# Patient Record
Sex: Male | Born: 1995 | Race: Black or African American | Hispanic: No | Marital: Single | Smoking: Current every day smoker
Health system: Southern US, Community
[De-identification: ages and names within clinical notes are randomized; demographics above are authoritative.]

---
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
## 2017-07-02 ENCOUNTER — Ambulatory Visit (HOSPITAL_COMMUNITY)
Admission: EM | Admit: 2017-07-02 | Discharge: 2017-07-02 | Disposition: A | Discharge status: Home / Self Care | Payer: Self-pay | Attending: Physician Assistant | Admitting: Physician Assistant

## 2017-07-02 ENCOUNTER — Encounter (HOSPITAL_COMMUNITY): Payer: Self-pay | Admitting: Emergency Medicine

## 2017-07-02 DIAGNOSIS — Z113 Encounter for screening for infections with a predominantly sexual mode of transmission: Secondary | ICD-10-CM

## 2017-07-02 DIAGNOSIS — Z202 Contact with and (suspected) exposure to infections with a predominantly sexual mode of transmission: Secondary | ICD-10-CM

## 2017-07-02 NOTE — Discharge Instructions (Signed)
Cytology and blood work sent, you will be contacted with any positive results that requires further treatment. Refrain from sexual activity for the next 7 days. Using condoms can lower chances of STDs. Monitor for any worsening of symptoms, fever, abdominal pain, nausea, vomiting, to follow up for reevaluation.

## 2017-07-02 NOTE — ED Provider Notes (Signed)
[facility]    CSN: [id number] Arrival date & time: 07/02/17  1240     History   Chief Complaint Chief Complaint  Patient presents with  . Exposure to STD    HPI Brent SauersCameron N Moran is a 21 y.o. male.   21 year old male comes in for STD testing. Patient states one of his partners is being treated for STD, but does not know what. Patient is asymptomatic and specifically denies penile lesions, penile discharge, testicular pain/swelling, abdominal pain. Patient is sexually active with multiple partners, stating "just put down 10", intermittent condom use. He would also like to be tested for HIV, syphilis, HSV      History reviewed. No pertinent past medical history.  There are no active problems to display for this patient.   Past Surgical History:  Procedure Laterality Date  . SCROTAL EXPLORATION N/A 09/01/2013   Procedure: SCROTAL WOUND DEBRIDEMENT;  Surgeon: Milford Cageaniel Young Woodruff, MD;  Location: [facility];  Service: Urology;  Laterality: N/A;       Home Medications    Prior to Admission medications   Not on File    Family History History reviewed. No pertinent family history.  Social History Social History  Substance Use Topics  . Smoking status: Current Every Day Smoker  . Smokeless tobacco: Never Used  . Alcohol use No     Allergies   Other   Review of Systems Review of Systems  Reason unable to perform ROS: See HPI as above.     Physical Exam Triage Vital Signs ED Triage Vitals [07/02/17 1357]  Enc Vitals Group     BP 128/79     Pulse Rate 64     Resp 20     Temp 98.5 F (36.9 C)     Temp Source Oral     SpO2 99 %     Weight      Height      Head Circumference      Peak Flow      Pain Score      Pain Loc      Pain Edu?      Excl. in GC?    No data found.   Updated Vital Signs BP 128/79 (BP Location: Left Arm)   Pulse 64   Temp 98.5 F (36.9 C) (Oral)   Resp 20   SpO2 99%   Physical Exam  Constitutional: He is  oriented to person, place, and time. He appears well-developed and well-nourished. No distress.  HENT:  Head: Normocephalic and atraumatic.  Eyes: Pupils are equal, round, and reactive to light. Conjunctivae are normal.  Neurological: He is alert and oriented to person, place, and time.     UC Treatments / Results  Labs (all labs ordered are listed, but only abnormal results are displayed) Labs Reviewed  HIV ANTIBODY (ROUTINE TESTING)  RPR  HSV 1 ANTIBODY, IGG  HSV 2 ANTIBODY, IGG  URINE CYTOLOGY ANCILLARY ONLY    EKG  EKG Interpretation None       Radiology No results found.  Procedures Procedures (including critical care time)  Medications Ordered in UC Medications - No data to display   Initial Impression / Assessment and Plan / UC Course  I have reviewed the triage vital signs and the nursing notes.  Pertinent labs & imaging results that were available during my care of the patient were reviewed by me and considered in my medical decision making (see chart for details).  Patient declined empiric treatment of GC. Cytology and blood work sent, patient will be contacted with any positive results that require additional treatment. Patient to refrain from sexual activity for the next 7 days. Return precautions given.  Final Clinical Impressions(s) / UC Diagnoses   Final diagnoses:  STD exposure    New Prescriptions Discharge Medication List as of 07/02/2017  2:21 PM        Belinda Fisher, PA-C 07/02/17 1437

## 2017-07-02 NOTE — ED Notes (Signed)
Call back number verified and updated in EPIC... Adv pt to not have SI until lab results comeback neg.... Also adv pt lab results will be on MyChart; instructions given .... Pt verb understanding.   

## 2017-07-02 NOTE — ED Notes (Signed)
Obtained a urine specimen. Specimen in lab

## 2017-07-02 NOTE — ED Triage Notes (Signed)
Pt here to be screened for STDs... sts one of his partners is being treated for an STI  Pt is asymptomatic.   Does not wear condoms  Dirty urine sample collected.   A&O x4... NAD>.. Ambulatory

## 2017-07-03 LAB — HSV 2 ANTIBODY, IGG: HSV 2 Glycoprotein G Ab, IgG: <0.91 index (ref 0.00–0.90)

## 2017-07-03 LAB — HSV 1 ANTIBODY, IGG: HSV 1 Glycoprotein G Ab, IgG: <0.91 index (ref 0.00–0.90)

## 2017-07-03 LAB — URINE CYTOLOGY ANCILLARY ONLY
CHLAMYDIA, DNA PROBE: POSITIVE — AB
Neisseria Gonorrhea: NEGATIVE
Trichomonas: NEGATIVE

## 2017-07-03 LAB — HIV ANTIBODY (ROUTINE TESTING W REFLEX): HIV SCREEN 4TH GENERATION: NONREACTIVE

## 2017-07-03 LAB — RPR: RPR Ser Ql: NONREACTIVE

## 2017-07-04 ENCOUNTER — Telehealth (HOSPITAL_COMMUNITY): Payer: Self-pay | Admitting: Internal Medicine

## 2017-07-04 MED ORDER — AZITHROMYCIN 500 MG PO TABS: 1000.0000 mg | ORAL_TABLET | Freq: Once | ORAL | 0 refills | Status: AC

## 2017-07-04 NOTE — Telephone Encounter (Signed)
clinical staff, please let patient know that test for chlamydia was positive.  Rx zithromax was sent to the pharmacy of record, [facility] on [address] [place].  Please refrain from sexual intercourse for 7 days to give the medicine time to work.  Sexual partners need to be notified and tested/treated.  Condoms may reduce risk of reinfection.  Recheck for further evaluation if symptoms are not improving.   LM

## 2018-02-03 DEATH — deceased

## 2024-03-22 ENCOUNTER — Encounter: Payer: Self-pay | Admitting: Advanced Practice Midwife
